# Patient Record
Sex: Male | Born: 1995 | Race: Black or African American | Hispanic: No | Marital: Single | Smoking: Never smoker
Health system: Southern US, Community
[De-identification: ages and names within clinical notes are randomized; demographics above are authoritative.]

## PROBLEM LIST (undated history)

## (undated) ENCOUNTER — Ambulatory Visit (HOSPITAL_COMMUNITY): Payer: BLUE CROSS/BLUE SHIELD

---
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
## 2019-09-19 ENCOUNTER — Ambulatory Visit (HOSPITAL_COMMUNITY)
Admission: EM | Admit: 2019-09-19 | Discharge: 2019-09-19 | Disposition: A | Discharge status: Home / Self Care | Payer: 59 | Attending: Family Medicine | Admitting: Family Medicine

## 2019-09-19 ENCOUNTER — Encounter (HOSPITAL_COMMUNITY): Payer: Self-pay

## 2019-09-19 DIAGNOSIS — U071 COVID-19: Secondary | ICD-10-CM | POA: Diagnosis not present

## 2019-09-19 DIAGNOSIS — Z20828 Contact with and (suspected) exposure to other viral communicable diseases: Secondary | ICD-10-CM

## 2019-09-19 DIAGNOSIS — R05 Cough: Secondary | ICD-10-CM

## 2019-09-19 DIAGNOSIS — R438 Other disturbances of smell and taste: Secondary | ICD-10-CM

## 2019-09-19 DIAGNOSIS — R0981 Nasal congestion: Secondary | ICD-10-CM

## 2019-09-19 DIAGNOSIS — R059 Cough, unspecified: Secondary | ICD-10-CM

## 2019-09-19 DIAGNOSIS — Z20822 Contact with and (suspected) exposure to covid-19: Secondary | ICD-10-CM

## 2019-09-19 NOTE — ED Triage Notes (Signed)
Pt present loss of smell and taste with some coughing and nasal congestion. Symptoms started two days ago.

## 2019-09-19 NOTE — Discharge Instructions (Addendum)
Your COVID test is pending.  You should self quarantine until your test result is back and is negative.   ° °Go to the emergency department if you develop high fever, shortness of breath, severe diarrhea, or other concerning symptoms.   ° °

## 2019-09-19 NOTE — ED Provider Notes (Signed)
[facility]    CSN: [id number] Arrival date & time: 09/19/19  1634      History   Chief Complaint Chief Complaint  Patient presents with  . Loss of smell and taste  . Cough  . Nasal Congestion    HPI Tony Lucas is a 23 y.o. male.   Patient presents with 2-day history of nonproductive cough, nasal congestion, loss of sense of smell/taste.  He denies fever, chills, sore throat, shortness of breath, abdominal pain, vomiting, diarrhea, rash, or other symptoms.  No treatments attempted at home.  No pertinent medical history.     The history is provided by the patient.    History reviewed. No pertinent past medical history.  There are no active problems to display for this patient.   History reviewed. No pertinent surgical history.     Home Medications    Prior to Admission medications   Medication Sig Start Date End Date Taking? Authorizing Provider  amoxicillin (AMOXIL) 500 MG capsule Take 2 capsules (1,000 mg total) by mouth 3 (three) times daily. 01/01/13   Godfrey Pick, PA-C  ofloxacin (FLOXIN) 0.3 % otic solution Place 5 drops into the left ear 2 (two) times daily. 01/01/13   Godfrey Pick, PA-C    Family History Family History  Problem Relation Age of Onset  . Hypertension Mother     Social History Social History   Tobacco Use  . Smoking status: Never Smoker  . Smokeless tobacco: Never Used  Substance Use Topics  . Alcohol use: No  . Drug use: No     Allergies   Patient has no known allergies.   Review of Systems Review of Systems  Constitutional: Negative for chills and fever.  HENT: Positive for congestion. Negative for ear pain and sore throat.   Eyes: Negative for pain and visual disturbance.  Respiratory: Positive for cough. Negative for shortness of breath.   Cardiovascular: Negative for chest pain and palpitations.  Gastrointestinal: Negative for abdominal pain and vomiting.  Genitourinary: Negative for dysuria and  hematuria.  Musculoskeletal: Negative for arthralgias and back pain.  Skin: Negative for color change and rash.  Neurological: Negative for seizures and syncope.  All other systems reviewed and are negative.    Physical Exam Triage Vital Signs ED Triage Vitals  Enc Vitals Group     BP      Pulse      Resp      Temp      Temp src      SpO2      Weight      Height      Head Circumference      Peak Flow      Pain Score      Pain Loc      Pain Edu?      Excl. in GC?    No data found.  Updated Vital Signs BP (!) 132/59 (BP Location: Left Arm)   Pulse 69   Temp 99.4 F (37.4 C) (Oral)   Resp 16   SpO2 100%   Visual Acuity Right Eye Distance:   Left Eye Distance:   Bilateral Distance:    Right Eye Near:   Left Eye Near:    Bilateral Near:     Physical Exam Vitals signs and nursing note reviewed.  Constitutional:      General: He is not in acute distress.    Appearance: He is well-developed. He is not ill-appearing.  HENT:  Head: Normocephalic and atraumatic.     Right Ear: Tympanic membrane normal.     Left Ear: Tympanic membrane normal.     Nose: Nose normal.     Mouth/Throat:     Mouth: Mucous membranes are moist.     Pharynx: Oropharynx is clear.  Eyes:     Conjunctiva/sclera: Conjunctivae normal.  Neck:     Musculoskeletal: Neck supple.  Cardiovascular:     Rate and Rhythm: Normal rate and regular rhythm.     Heart sounds: No murmur.  Pulmonary:     Effort: Pulmonary effort is normal. No respiratory distress.     Breath sounds: Normal breath sounds.  Abdominal:     General: Bowel sounds are normal.     Palpations: Abdomen is soft.     Tenderness: There is no abdominal tenderness. There is no guarding or rebound.  Skin:    General: Skin is warm and dry.     Findings: No rash.  Neurological:     General: No focal deficit present.     Mental Status: He is alert and oriented to person, place, and time.      UC Treatments / Results  Labs  (all labs ordered are listed, but only abnormal results are displayed) Labs Reviewed  NOVEL CORONAVIRUS, NAA (HOSP ORDER, SEND-OUT TO REF LAB; TAT 18-24 HRS)    EKG   Radiology No results found.  Procedures Procedures (including critical care time)  Medications Ordered in UC Medications - No data to display  Initial Impression / Assessment and Plan / UC Course  I have reviewed the triage vital signs and the nursing notes.  Pertinent labs & imaging results that were available during my care of the patient were reviewed by me and considered in my medical decision making (see chart for details).   Cough, Suspected Covid.  Instructed patient to take Tylenol as needed for fever or discomfort.  COVID test performed here.  Instructed patient to self quarantine until the test result is back.  Instructed patient to go to the emergency department if he develops high fever, shortness of breath, severe diarrhea, or other concerning symptoms.  Patient agrees with plan of care.    Final Clinical Impressions(s) / UC Diagnoses   Final diagnoses:  Cough  Suspected COVID-19 virus infection     Discharge Instructions     Your COVID test is pending.  You should self quarantine until your test result is back and is negative.    Go to the emergency department if you develop high fever, shortness of breath, severe diarrhea, or other concerning symptoms.       ED Prescriptions    None     PDMP not reviewed this encounter.   Sharion Balloon, NP 09/19/19 [phone]

## 2019-09-20 LAB — NOVEL CORONAVIRUS, NAA (HOSP ORDER, SEND-OUT TO REF LAB; TAT 18-24 HRS): SARS-CoV-2, NAA: DETECTED — AB

## 2019-09-21 ENCOUNTER — Telehealth (HOSPITAL_COMMUNITY): Payer: Self-pay | Admitting: Emergency Medicine

## 2019-09-21 NOTE — Telephone Encounter (Signed)
Positive covid, discussed results with patient over the phone, about quarantine times and s/s to watch out for. Pt verbalized understanding, all questions answered.

## 2019-09-22 ENCOUNTER — Telehealth (HOSPITAL_COMMUNITY): Payer: Self-pay | Admitting: Emergency Medicine

## 2019-09-22 NOTE — Telephone Encounter (Signed)
Patient called asking what pharmacy her medications had been sent to.  Reviewed notes from most recent visit 1026/2020-no medicines sent to pharmacy, no discussion of medicines in provider note.   When patient was asked what medicines she was expecting, she replied with names of 2 medications-looking ar medical record, these 2 drugs were prescribed at a previous visit several years ago.  Patient accepted response and thanked this nurse and patient ended call.

## 2021-09-25 ENCOUNTER — Ambulatory Visit (HOSPITAL_COMMUNITY)
Admission: EM | Admit: 2021-09-25 | Discharge: 2021-09-25 | Disposition: A | Discharge status: Home / Self Care | Payer: BLUE CROSS/BLUE SHIELD | Attending: Family Medicine | Admitting: Family Medicine

## 2021-09-25 ENCOUNTER — Encounter (HOSPITAL_COMMUNITY): Payer: Self-pay

## 2021-09-25 ENCOUNTER — Other Ambulatory Visit: Payer: Self-pay

## 2021-09-25 DIAGNOSIS — R509 Fever, unspecified: Secondary | ICD-10-CM | POA: Insufficient documentation

## 2021-09-25 DIAGNOSIS — H65192 Other acute nonsuppurative otitis media, left ear: Secondary | ICD-10-CM | POA: Insufficient documentation

## 2021-09-25 DIAGNOSIS — J069 Acute upper respiratory infection, unspecified: Secondary | ICD-10-CM | POA: Diagnosis not present

## 2021-09-25 DIAGNOSIS — R059 Cough, unspecified: Secondary | ICD-10-CM | POA: Diagnosis not present

## 2021-09-25 DIAGNOSIS — U071 COVID-19: Secondary | ICD-10-CM | POA: Insufficient documentation

## 2021-09-25 LAB — SARS CORONAVIRUS 2 (TAT 6-24 HRS): SARS Coronavirus 2: POSITIVE — AB

## 2021-09-25 MED ORDER — LIDOCAINE VISCOUS HCL 2 % MT SOLN: 5.0000 mL | OROMUCOSAL | 0 refills | Status: DC | PRN

## 2021-09-25 MED ORDER — PROMETHAZINE-DM 6.25-15 MG/5ML PO SYRP: 5.0000 mL | ORAL_SOLUTION | Freq: Four times a day (QID) | ORAL | 0 refills | Status: DC | PRN

## 2021-09-25 NOTE — ED Provider Notes (Signed)
[facility]    CSN: [id number] Arrival date & time: 09/25/21  1632      History   Chief Complaint Chief Complaint  Patient presents with   Fever   Ear Pain   Cough    HPI Tony Lucas is a 25 y.o. male.   Patient presenting today with 2-day history of left ear irritation, congestion, sore throat, fever, cough.  Denies chest pain, shortness of breath, abdominal pain, nausea vomiting diarrhea, body aches, chills.  So far taking ibuprofen with mild temporary relief of symptoms.  No known sick contacts that he is aware of.  No known chronic pertinent medical problems.  History reviewed. No pertinent past medical history.  There are no problems to display for this patient.   History reviewed. No pertinent surgical history.     Home Medications    Prior to Admission medications   Medication Sig Start Date End Date Taking? Authorizing Provider  lidocaine (XYLOCAINE) 2 % solution Use as directed 5 mLs in the mouth or throat every 4 (four) hours as needed for mouth pain. 09/25/21  Yes Particia Nearing, PA-C  promethazine-dextromethorphan (PROMETHAZINE-DM) 6.25-15 MG/5ML syrup Take 5 mLs by mouth 4 (four) times daily as needed for cough. 09/25/21  Yes Particia Nearing, PA-C  amoxicillin (AMOXIL) 500 MG capsule Take 2 capsules (1,000 mg total) by mouth 3 (three) times daily. 01/01/13   Godfrey Pick, PA-C  ofloxacin (FLOXIN) 0.3 % otic solution Place 5 drops into the left ear 2 (two) times daily. 01/01/13   Godfrey Pick, PA-C    Family History Family History  Problem Relation Age of Onset   Hypertension Mother     Social History Social History   Tobacco Use   Smoking status: Never   Smokeless tobacco: Never  Substance Use Topics   Alcohol use: No   Drug use: No     Allergies   Patient has no known allergies.   Review of Systems Review of Systems Per HPI  Physical Exam Triage Vital Signs ED Triage Vitals  Enc Vitals Group     BP  09/25/21 1751 126/71     Pulse Rate 09/25/21 1751 71     Resp 09/25/21 1751 19     Temp 09/25/21 1751 99.7 F (37.6 C)     Temp Source 09/25/21 1751 Oral     SpO2 09/25/21 1751 98 %     Weight --      Height --      Head Circumference --      Peak Flow --      Pain Score 09/25/21 1752 7     Pain Loc --      Pain Edu? --      Excl. in GC? --    No data found.  Updated Vital Signs BP 126/71 (BP Location: Right Arm)   Pulse 71   Temp 99.7 F (37.6 C) (Oral)   Resp 19   SpO2 98%   Visual Acuity Right Eye Distance:   Left Eye Distance:   Bilateral Distance:    Right Eye Near:   Left Eye Near:    Bilateral Near:     Physical Exam Vitals and nursing note reviewed.  Constitutional:      Appearance: Normal appearance.  HENT:     Head: Atraumatic.     Right Ear: Tympanic membrane normal.     Left Ear: Tympanic membrane normal.     Nose: Rhinorrhea present.  Mouth/Throat:     Mouth: Mucous membranes are moist.     Pharynx: Posterior oropharyngeal erythema present. No oropharyngeal exudate.  Eyes:     Extraocular Movements: Extraocular movements intact.     Conjunctiva/sclera: Conjunctivae normal.  Cardiovascular:     Rate and Rhythm: Normal rate and regular rhythm.     Heart sounds: Normal heart sounds.  Pulmonary:     Effort: Pulmonary effort is normal. No respiratory distress.     Breath sounds: Normal breath sounds. No wheezing.  Musculoskeletal:        General: Normal range of motion.     Cervical back: Normal range of motion and neck supple.  Skin:    General: Skin is warm and dry.  Neurological:     General: No focal deficit present.     Mental Status: He is oriented to person, place, and time.  Psychiatric:        Mood and Affect: Mood normal.        Thought Content: Thought content normal.        Judgment: Judgment normal.     UC Treatments / Results  Labs (all labs ordered are listed, but only abnormal results are displayed) Labs Reviewed   RESPIRATORY PANEL BY PCR  SARS CORONAVIRUS 2 (TAT 6-24 HRS)    EKG   Radiology No results found.  Procedures Procedures (including critical care time)  Medications Ordered in UC Medications - No data to display  Initial Impression / Assessment and Plan / UC Course  I have reviewed the triage vital signs and the nursing notes.  Pertinent labs & imaging results that were available during my care of the patient were reviewed by me and considered in my medical decision making (see chart for details).     Suspect viral illness causing symptoms, vital signs and exam overall reassuring today and he appears in no acute distress.  Respiratory panel, COVID swab pending.  Treat with Phenergan DM, viscous lidocaine for symptomatic benefit in addition to over-the-counter cold and congestion medications and fever reducers.  Discussed Flonase, Sudafed to help with the ear irritation, pressure.  Work note given.  Return for acutely worsening symptoms.  Final Clinical Impressions(s) / UC Diagnoses   Final diagnoses:  Viral URI with cough  Fever, unspecified  Acute effusion of left ear   Discharge Instructions   None    ED Prescriptions     Medication Sig Dispense Auth. Provider   promethazine-dextromethorphan (PROMETHAZINE-DM) 6.25-15 MG/5ML syrup Take 5 mLs by mouth 4 (four) times daily as needed for cough. 100 mL Particia Nearing, PA-C   lidocaine (XYLOCAINE) 2 % solution Use as directed 5 mLs in the mouth or throat every 4 (four) hours as needed for mouth pain. 50 mL Particia Nearing, [place]      PDMP not reviewed this encounter.   Particia Nearing, [place] 09/25/21 1815

## 2021-09-25 NOTE — ED Triage Notes (Signed)
Pt presents with L ear itching, congestion, sore throat and fever x 2 days.   Pt states he has taken Ibuprofen at  home.

## 2021-09-27 LAB — RESPIRATORY PANEL BY PCR

## 2021-10-01 ENCOUNTER — Telehealth (HOSPITAL_COMMUNITY): Payer: Self-pay | Admitting: Internal Medicine

## 2021-10-01 MED ORDER — PROMETHAZINE-DM 6.25-15 MG/5ML PO SYRP: 5.0000 mL | ORAL_SOLUTION | Freq: Four times a day (QID) | ORAL | 0 refills | Status: DC | PRN

## 2021-10-01 NOTE — Telephone Encounter (Signed)
Medication refill sent to the pharmacy  

## 2021-10-29 ENCOUNTER — Other Ambulatory Visit: Payer: Self-pay

## 2021-10-29 ENCOUNTER — Ambulatory Visit
Admission: EM | Admit: 2021-10-29 | Discharge: 2021-10-29 | Disposition: A | Discharge status: Home / Self Care | Payer: BC Managed Care – PPO | Attending: Emergency Medicine | Admitting: Emergency Medicine

## 2021-10-29 DIAGNOSIS — M545 Low back pain, unspecified: Secondary | ICD-10-CM

## 2021-10-29 MED ORDER — BACLOFEN 10 MG PO TABS: 10.0000 mg | ORAL_TABLET | Freq: Three times a day (TID) | ORAL | 0 refills | Status: AC

## 2021-10-29 MED ORDER — IBUPROFEN 800 MG PO TABS: 800.0000 mg | ORAL_TABLET | Freq: Three times a day (TID) | ORAL | 0 refills | Status: DC

## 2021-10-29 MED ORDER — KETOROLAC TROMETHAMINE 60 MG/2ML IM SOLN
60.0000 mg | Freq: Once | INTRAMUSCULAR | Status: AC
  Administered 2021-10-29: 60 mg via INTRAMUSCULAR

## 2021-10-29 NOTE — Discharge Instructions (Signed)
You received an injection of ketorolac today, high-dose nonsteroidal anti-inflammatory pain medication that should significantly reduce your pain for the next 6 to 8 hours.  Please begin taking baclofen and ibuprofen tonight, prescriptions have been sent to your pharmacy.  I have provided you with a note to be out of work for the next few days, please take advantage of this time to drink plenty of fluid, get rest and perform gentle stretching as tolerated without pain.
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
## 2022-04-16 ENCOUNTER — Ambulatory Visit (HOSPITAL_COMMUNITY)
Admission: EM | Admit: 2022-04-16 | Discharge: 2022-04-16 | Disposition: A | Discharge status: Home / Self Care | Payer: BC Managed Care – PPO | Attending: Emergency Medicine | Admitting: Emergency Medicine

## 2022-04-16 ENCOUNTER — Ambulatory Visit (INDEPENDENT_AMBULATORY_CARE_PROVIDER_SITE_OTHER): Payer: BC Managed Care – PPO

## 2022-04-16 ENCOUNTER — Encounter (HOSPITAL_COMMUNITY): Payer: Self-pay

## 2022-04-16 DIAGNOSIS — M25511 Pain in right shoulder: Secondary | ICD-10-CM

## 2022-04-16 DIAGNOSIS — M25561 Pain in right knee: Secondary | ICD-10-CM | POA: Diagnosis not present

## 2022-04-16 DIAGNOSIS — M898X1 Other specified disorders of bone, shoulder: Secondary | ICD-10-CM | POA: Diagnosis not present

## 2022-04-16 MED ORDER — IBUPROFEN 800 MG PO TABS
ORAL_TABLET | ORAL | Status: AC
  Filled 2022-04-16: qty 1

## 2022-04-16 MED ORDER — IBUPROFEN 800 MG PO TABS
800.0000 mg | ORAL_TABLET | Freq: Once | ORAL | Status: AC
  Administered 2022-04-16: 800 mg via ORAL

## 2022-04-16 MED ORDER — IBUPROFEN 100 MG/5ML PO SUSP: 400.0000 mg | Freq: Once | ORAL | Status: DC

## 2022-04-16 MED ORDER — IBUPROFEN 800 MG PO TABS: 800.0000 mg | ORAL_TABLET | Freq: Three times a day (TID) | ORAL | 0 refills | Status: DC | PRN

## 2022-04-16 NOTE — ED Provider Notes (Incomplete)
  [facility]    CSN: [id number] Arrival date & time: 04/16/22  1657      History   Chief Complaint Chief Complaint  Patient presents with   Shoulder Pain   Knee Pain    HPI Tony Lucas is a 26 y.o. male.   HPI  History reviewed. No pertinent past medical history.  There are no problems to display for this patient.   History reviewed. No pertinent surgical history.     Home Medications    Prior to Admission medications   Not on File    Family History Family History  Problem Relation Age of Onset   Hypertension Mother     Social History Social History   Tobacco Use   Smoking status: Never   Smokeless tobacco: Never  Substance Use Topics   Alcohol use: No   Drug use: No     Allergies   Patient has no known allergies.   Review of Systems Review of Systems   Physical Exam Triage Vital Signs ED Triage Vitals  Enc Vitals Group     BP 04/16/22 1714 138/73     Pulse Rate 04/16/22 1714 88     Resp 04/16/22 1714 16     Temp 04/16/22 1714 98 F (36.7 C)     Temp Source 04/16/22 1714 Oral     SpO2 04/16/22 1714 99 %     Weight --      Height --      Head Circumference --      Peak Flow --      Pain Score 04/16/22 1716 10     Pain Loc --      Pain Edu? --      Excl. in GC? --    No data found.  Updated Vital Signs BP 138/73 (BP Location: Left Arm)   Pulse 88   Temp 98 F (36.7 C) (Oral)   Resp 16   SpO2 99%   Visual Acuity Right Eye Distance:   Left Eye Distance:   Bilateral Distance:    Right Eye Near:   Left Eye Near:    Bilateral Near:     Physical Exam   UC Treatments / Results  Labs (all labs ordered are listed, but only abnormal results are displayed) Labs Reviewed - No data to display  EKG   Radiology No results found.  Procedures Procedures (including critical care time)  Medications Ordered in UC Medications  ibuprofen (ADVIL) tablet 800 mg (800 mg Oral Given 04/16/22 1758)    Initial  Impression / Assessment and Plan / UC Course  I have reviewed the triage vital signs and the nursing notes.  Pertinent labs & imaging results that were available during my care of the patient were reviewed by me and considered in my medical decision making (see chart for details).     *** Final Clinical Impressions(s) / UC Diagnoses   Final diagnoses:  Acute pain of right knee  Clavicle pain   Discharge Instructions   None    ED Prescriptions   None    PDMP not reviewed this encounter.

## 2022-04-16 NOTE — Discharge Instructions (Addendum)
The x-ray of your clavicle, shoulder and knee are reassuringly normal, there is no sign of any acute fracture.  I have included the x-ray reports in your checkout sheet today for your records.  I provided you with a prescription for ibuprofen 800 mg that I want you to take 3 times daily as needed for pain.  For comfort, if you would like to purchase a soft knee brace from the pharmacy for stabilization I think this might be helpful.  The ones that we have here are rigid and would not serve this purpose well.  Please apply ice to affected areas for the next 24 to 48 hours, then begin to apply heat as needed for comfort.  Ice will reduce inflammation and, once the inflammation has decreased, heat will increase circulation and promote healing.  Please follow-up with [facility] at the urgent care walk-in clinic in 5 to 7 days if you have not had significant improvement of your pain and swelling.  Please go to the emergency room if your pain worsens during these next 5 to 7 days.  Thank you for visiting urgent care today.

## 2022-04-16 NOTE — ED Provider Notes (Signed)
[facility]    CSN: [id number] Arrival date & time: 04/16/22  1657    HISTORY   Chief Complaint  Patient presents with   Shoulder Pain   Knee Pain   HPI Tony Lucas is a 26 y.o. male. Pt states he injured his right clavicle and right knee last night while playing basketball, states he was body checked by a player on the opposing team who hit him on the right side of his chest with his right shoulder causing him to fall.  Patient states he fell awkwardly and now his knee is swollen and sore as well. Pt states he took 400 mg of ibuprofen without relief.  Patient states he is notices had some swelling in the middle aspect of his right upper chest across his clavicle bone, reports pain with trying to shrug his shoulders.  Patient is weightbearing and ambulating without assistance.  Patient denies prior injury to right knee, right clavicle, right shoulder.  The history is provided by the patient.  History reviewed. No pertinent past medical history. There are no problems to display for this patient.  History reviewed. No pertinent surgical history.  Home Medications    Prior to Admission medications   Medication Sig Start Date End Date Taking? Authorizing Provider  ibuprofen (ADVIL) 800 MG tablet Take 1 tablet (800 mg total) by mouth 3 (three) times daily. 10/29/21   Theadora Rama Scales, PA-C    Family History Family History  Problem Relation Age of Onset   Hypertension Mother    Social History Social History   Tobacco Use   Smoking status: Never   Smokeless tobacco: Never  Substance Use Topics   Alcohol use: No   Drug use: No   Allergies   Patient has no known allergies.  Review of Systems Review of Systems Pertinent findings noted in history of present illness.   Physical Exam Triage Vital Signs ED Triage Vitals  Enc Vitals Group     BP 09/21/21 0827 (!) 147/82     Pulse Rate 09/21/21 0827 72     Resp 09/21/21 0827 18     Temp 09/21/21 0827  98.3 F (36.8 C)     Temp Source 09/21/21 0827 Oral     SpO2 09/21/21 0827 98 %     Weight --      Height --      Head Circumference --      Peak Flow --      Pain Score 09/21/21 0826 5     Pain Loc --      Pain Edu? --      Excl. in GC? --   No data found.  Updated Vital Signs BP 138/73 (BP Location: Left Arm)   Pulse 88   Temp 98 F (36.7 C) (Oral)   Resp 16   SpO2 99%   Physical Exam Chest:     Chest wall: Deformity, swelling and tenderness present. No mass, lacerations, crepitus or edema. There is no dullness to percussion.  Breasts:    Breasts are symmetrical.    Musculoskeletal:     Right shoulder: No swelling, deformity, effusion, laceration, tenderness, bony tenderness or crepitus. Decreased range of motion. Decreased strength.     Left shoulder: Normal.     Right upper arm: Normal.     Left upper arm: Normal.     Cervical back: Normal.     Thoracic back: Normal.     Lumbar back: Normal.  Right hip: Normal.     Left hip: Normal.     Right upper leg: Normal.     Left upper leg: Normal.     Right knee: Swelling, effusion, erythema, ecchymosis and laceration present. No deformity, bony tenderness or crepitus. Normal range of motion. Tenderness present over the patellar tendon. Normal alignment.     Left knee: Normal.     Right lower leg: Normal.     Left lower leg: Normal.    Visual Acuity Right Eye Distance:   Left Eye Distance:   Bilateral Distance:    Right Eye Near:   Left Eye Near:    Bilateral Near:     UC Couse / Diagnostics / Procedures:    EKG  Radiology DG Clavicle Right  Result Date: 04/16/2022 CLINICAL DATA:  Injured right shoulder and knee last night while playing basketball. EXAM: RIGHT CLAVICLE - 2+ VIEWS COMPARISON:  None Available. FINDINGS: Normal bone mineralization. The sternoclavicular and acromioclavicular joint spaces are normally aligned. No acute fracture. The glenohumeral joint is unremarkable. IMPRESSION: Normal right  clavicle radiographs. Electronically Signed   By: Neita Garnetonald  Viola M.D.   On: 04/16/2022 18:13   DG Knee Complete 4 Views Right  Result Date: 04/16/2022 CLINICAL DATA:  Right knee injury while playing basketball yesterday. EXAM: RIGHT KNEE - COMPLETE 4+ VIEW COMPARISON:  None Available. FINDINGS: Normal bone mineralization. Joint spaces are preserved. No joint effusion. No acute fracture or dislocation. IMPRESSION: Normal right knee radiographs. Electronically Signed   By: Neita Garnetonald  Viola M.D.   On: 04/16/2022 18:13    Procedures Procedures (including critical care time)  UC Diagnoses / Final Clinical Impressions(s)   I have reviewed the triage vital signs and the nursing notes.  Pertinent labs & imaging results that were available during my care of the patient were reviewed by me and considered in my medical decision making (see chart for details).    Final diagnoses:  Acute pain of right knee  Clavicle pain   X-rays of clavicle and knee are unremarkable for acute bony injury.  Patient reassured.  Patient provided with prescription for ibuprofen, recommend ice then heat for the next few days.  Patient advised to go to [facility] urgent care for follow-up if needed.  Patient also advised to go to emergency room for worsening pain.  Patient advised he may benefit from a soft brace that can be purchased at the pharmacy for comfort.  Return precautions advised.  ED Prescriptions     Medication Sig Dispense Auth. Provider   ibuprofen (ADVIL) 800 MG tablet Take 1 tablet (800 mg total) by mouth every 8 (eight) hours as needed for up to 21 doses for fever, headache, mild pain or moderate pain. 21 tablet Theadora RamaMorgan, Janissa Bertram Scales, PA-C      PDMP not reviewed this encounter.  Pending results:  Labs Reviewed - No data to display  Medications Ordered in UC: Medications  ibuprofen (ADVIL) tablet 800 mg (800 mg Oral Given 04/16/22 1758)    Disposition Upon Discharge:  Condition: stable for  discharge home Home: take medications as prescribed; routine discharge instructions as discussed; follow up as advised.  Patient presented with an acute illness with associated systemic symptoms and significant discomfort requiring urgent management. In my opinion, this is a condition that a prudent lay person (someone who possesses an average knowledge of health and medicine) may potentially expect to result in complications if not addressed urgently such as respiratory distress, impairment of bodily function or dysfunction  of bodily organs.   Routine symptom specific, illness specific and/or disease specific instructions were discussed with the patient and/or caregiver at length.   As such, the patient has been evaluated and assessed, work-up was performed and treatment was provided in alignment with urgent care protocols and evidence based medicine.  Patient/parent/caregiver has been advised that the patient may require follow up for further testing and treatment if the symptoms continue in spite of treatment, as clinically indicated and appropriate.  Patient/parent/caregiver has been advised to return to the [facility] or PCP if no better; to PCP or the Emergency Department if new signs and symptoms develop, or if the current signs or symptoms continue to change or worsen for further workup, evaluation and treatment as clinically indicated and appropriate  The patient will follow up with their current PCP if and as advised. If the patient does not currently have a PCP we will assist them in obtaining one.   The patient may need specialty follow up if the symptoms continue, in spite of conservative treatment and management, for further workup, evaluation, consultation and treatment as clinically indicated and appropriate.   Patient/parent/caregiver verbalized understanding and agreement of plan as discussed.  All questions were addressed during visit.  Please see discharge instructions below for further  details of plan.  Discharge Instructions:   Discharge Instructions      The x-ray of your clavicle, shoulder and knee are reassuringly normal, there is no sign of any acute fracture.  I have included the x-ray reports in your checkout sheet today for your records.  I provided you with a prescription for ibuprofen 800 mg that I want you to take 3 times daily as needed for pain.  For comfort, if you would like to purchase a soft knee brace from the pharmacy for stabilization I think this might be helpful.  The ones that we have here are rigid and would not serve this purpose well.  Please apply ice to affected areas for the next 24 to 48 hours, then begin to apply heat as needed for comfort.  Ice will reduce inflammation and, once the inflammation has decreased, heat will increase circulation and promote healing.  Please follow-up with [facility] at the urgent care walk-in clinic in 5 to 7 days if you have not had significant improvement of your pain and swelling.  Please go to the emergency room if your pain worsens during these next 5 to 7 days.  Thank you for visiting urgent care today.      This office note has been dictated using Teaching laboratory technician.  Unfortunately, and despite my best efforts, this method of dictation can sometimes lead to occasional typographical or grammatical errors.  I apologize in advance if this occurs.     Theadora Rama Scales, PA-C 04/16/22 1825

## 2022-04-16 NOTE — ED Triage Notes (Signed)
Pt states he injured his right shoulder and knee last night while playing basketball. Pt took ibuprofen with no relief.

## 2022-05-28 ENCOUNTER — Ambulatory Visit (HOSPITAL_COMMUNITY)
Admission: EM | Admit: 2022-05-28 | Discharge: 2022-05-28 | Disposition: A | Discharge status: Home / Self Care | Payer: 59 | Attending: Internal Medicine | Admitting: Internal Medicine

## 2022-05-28 ENCOUNTER — Encounter (HOSPITAL_COMMUNITY): Payer: Self-pay

## 2022-05-28 DIAGNOSIS — Z79899 Other long term (current) drug therapy: Secondary | ICD-10-CM | POA: Diagnosis not present

## 2022-05-28 DIAGNOSIS — J039 Acute tonsillitis, unspecified: Secondary | ICD-10-CM | POA: Insufficient documentation

## 2022-05-28 DIAGNOSIS — Z792 Long term (current) use of antibiotics: Secondary | ICD-10-CM | POA: Diagnosis not present

## 2022-05-28 DIAGNOSIS — J029 Acute pharyngitis, unspecified: Secondary | ICD-10-CM

## 2022-05-28 DIAGNOSIS — Z20822 Contact with and (suspected) exposure to covid-19: Secondary | ICD-10-CM | POA: Diagnosis not present

## 2022-05-28 DIAGNOSIS — R051 Acute cough: Secondary | ICD-10-CM | POA: Insufficient documentation

## 2022-05-28 LAB — POCT RAPID STREP A, ED / UC: Streptococcus, Group A Screen (Direct): NEGATIVE

## 2022-05-28 MED ORDER — AMOXICILLIN 500 MG PO CAPS: 500.0000 mg | ORAL_CAPSULE | Freq: Two times a day (BID) | ORAL | 0 refills | Status: AC

## 2022-05-28 MED ORDER — PROMETHAZINE-DM 6.25-15 MG/5ML PO SYRP: 5.0000 mL | ORAL_SOLUTION | Freq: Four times a day (QID) | ORAL | 0 refills | Status: DC | PRN

## 2022-05-28 NOTE — ED Provider Notes (Signed)
[facility]    CSN: [id number] Arrival date & time: 05/28/22  1359      History   Chief Complaint Chief Complaint  Patient presents with   Sore Throat    HPI Tony Lucas is a 26 y.o. male.   Patient presents with sore throat, right ear pain, cough that has been present for almost a week.  He reports that his significant other has had similar symptoms.  Denies any associated upper respiratory nasal congestion, runny nose, fever.  Denies chest pain, shortness of breath, nausea, vomiting, diarrhea, abdominal pain.  Patient has taken Motrin with minimal improvement.   Sore Throat    History reviewed. No pertinent past medical history.  There are no problems to display for this patient.   History reviewed. No pertinent surgical history.     Home Medications    Prior to Admission medications   Medication Sig Start Date End Date Taking? Authorizing Provider  amoxicillin (AMOXIL) 500 MG capsule Take 1 capsule (500 mg total) by mouth 2 (two) times daily for 10 days. 05/28/22 06/07/22 Yes Gadge Hermiz, Acie Fredrickson, FNP  promethazine-dextromethorphan (PROMETHAZINE-DM) 6.25-15 MG/5ML syrup Take 5 mLs by mouth 4 (four) times daily as needed for cough. 05/28/22  Yes Erma Raiche, Rolly Salter E, FNP  ibuprofen (ADVIL) 800 MG tablet Take 1 tablet (800 mg total) by mouth every 8 (eight) hours as needed for up to 21 doses for fever, headache, mild pain or moderate pain. 04/16/22   Theadora Rama Scales, PA-C    Family History Family History  Problem Relation Age of Onset   Hypertension Mother     Social History Social History   Tobacco Use   Smoking status: Never   Smokeless tobacco: Never  Substance Use Topics   Alcohol use: No   Drug use: No     Allergies   Patient has no known allergies.   Review of Systems Review of Systems Per HPI  Physical Exam Triage Vital Signs ED Triage Vitals  Enc Vitals Group     BP 05/28/22 1415 118/78     Pulse Rate 05/28/22 1415 (!) 56      Resp 05/28/22 1415 16     Temp 05/28/22 1415 98.3 F (36.8 C)     Temp Source 05/28/22 1415 Oral     SpO2 05/28/22 1415 98 %     Weight --      Height --      Head Circumference --      Peak Flow --      Pain Score 05/28/22 1416 10     Pain Loc --      Pain Edu? --      Excl. in GC? --    No data found.  Updated Vital Signs BP 118/78 (BP Location: Left Arm)   Pulse (!) 56   Temp 98.3 F (36.8 C) (Oral)   Resp 16   SpO2 98%   Visual Acuity Right Eye Distance:   Left Eye Distance:   Bilateral Distance:    Right Eye Near:   Left Eye Near:    Bilateral Near:     Physical Exam Constitutional:      General: He is not in acute distress.    Appearance: Normal appearance. He is not toxic-appearing or diaphoretic.  HENT:     Head: Normocephalic and atraumatic.     Right Ear: Tympanic membrane and ear canal normal.     Left Ear: Tympanic membrane and ear canal normal.  Nose: Congestion present.     Mouth/Throat:     Mouth: Mucous membranes are moist.     Pharynx: Posterior oropharyngeal erythema present. No oropharyngeal exudate.     Tonsils: Tonsillar exudate present. No tonsillar abscesses. 1+ on the right. 1+ on the left.  Eyes:     Extraocular Movements: Extraocular movements intact.     Conjunctiva/sclera: Conjunctivae normal.     Pupils: Pupils are equal, round, and reactive to light.  Cardiovascular:     Rate and Rhythm: Normal rate and regular rhythm.     Pulses: Normal pulses.     Heart sounds: Normal heart sounds.  Pulmonary:     Effort: Pulmonary effort is normal. No respiratory distress.     Breath sounds: Normal breath sounds. No stridor. No wheezing, rhonchi or rales.  Abdominal:     General: Abdomen is flat. Bowel sounds are normal.     Palpations: Abdomen is soft.  Musculoskeletal:        General: Normal range of motion.     Cervical back: Normal range of motion.  Skin:    General: Skin is warm and dry.  Neurological:     General: No focal  deficit present.     Mental Status: He is alert and oriented to person, place, and time. Mental status is at baseline.  Psychiatric:        Mood and Affect: Mood normal.        Behavior: Behavior normal.      UC Treatments / Results  Labs (all labs ordered are listed, but only abnormal results are displayed) Labs Reviewed  CULTURE, GROUP A STREP ([facility])  SARS CORONAVIRUS 2 (TAT 6-24 HRS)  POCT RAPID STREP A, ED / UC    EKG   Radiology No results found.  Procedures Procedures (including critical care time)  Medications Ordered in UC Medications - No data to display  Initial Impression / Assessment and Plan / UC Course  I have reviewed the triage vital signs and the nursing notes.  Pertinent labs & imaging results that were available during my care of the patient were reviewed by me and considered in my medical decision making (see chart for details).     Rapid strep was negative but still suspicious of strep throat given appearance of posterior pharynx and acute tonsillitis on exam.  Will opt to treat with amoxicillin antibiotic.  Throat culture and COVID test pending.  Patient requesting cough medication that was previously prescribed for viral illness months prior.  Will prescribe Promethazine DM as this appears to be what patient took.  Also patient was advised that this can cause drowsiness.  Discussed supportive care and symptom management with patient is well.  No signs of peritonsillar abscess on exam.  Discussed strict return precautions.  Patient verbalized understanding and was agreeable with plan. Final Clinical Impressions(s) / UC Diagnoses   Final diagnoses:  Tonsillitis  Sore throat  Acute cough     Discharge Instructions      Your rapid strep test was negative.  Throat culture and COVID test pending.  We will call if it is positive.  I am still highly suspicious of strep throat given appearance of your throat on exam so you are being treated with  amoxicillin antibiotic.  You have also been sent a cough medication to take as needed.  Please be advised that this cough medication can make you drowsy.  Do not drive while taking this cough medication.  Follow-up if symptoms  persist or worsen.     ED Prescriptions     Medication Sig Dispense Auth. Provider   amoxicillin (AMOXIL) 500 MG capsule Take 1 capsule (500 mg total) by mouth 2 (two) times daily for 10 days. 20 capsule Sabinal, [place] E, [place]   promethazine-dextromethorphan (PROMETHAZINE-DM) 6.25-15 MG/5ML syrup Take 5 mLs by mouth 4 (four) times daily as needed for cough. 118 mL Gustavus Bryant, [place]      PDMP not reviewed this encounter.   Gustavus Bryant, [place] 05/28/22 1517

## 2022-05-28 NOTE — ED Triage Notes (Signed)
Pt states sore throat and right ear pain for the past 4 days.  Has been taking motrin at home with relief.

## 2022-05-28 NOTE — Discharge Instructions (Signed)
Your rapid strep test was negative.  Throat culture and COVID test pending.  We will call if it is positive.  I am still highly suspicious of strep throat given appearance of your throat on exam so you are being treated with amoxicillin antibiotic.  You have also been sent a cough medication to take as needed.  Please be advised that this cough medication can make you drowsy.  Do not drive while taking this cough medication.  Follow-up if symptoms persist or worsen.

## 2022-05-29 LAB — SARS CORONAVIRUS 2 (TAT 6-24 HRS): SARS Coronavirus 2: NEGATIVE

## 2022-06-01 LAB — CULTURE, GROUP A STREP (THRC)

## 2023-03-02 ENCOUNTER — Ambulatory Visit (HOSPITAL_COMMUNITY)
Admission: EM | Admit: 2023-03-02 | Discharge: 2023-03-02 | Disposition: A | Discharge status: Home / Self Care | Payer: 59 | Attending: Family Medicine | Admitting: Family Medicine

## 2023-03-02 ENCOUNTER — Encounter (HOSPITAL_COMMUNITY): Payer: Self-pay | Admitting: Emergency Medicine

## 2023-03-02 ENCOUNTER — Other Ambulatory Visit: Payer: Self-pay

## 2023-03-02 DIAGNOSIS — L03314 Cellulitis of groin: Secondary | ICD-10-CM

## 2023-03-02 DIAGNOSIS — K0510 Chronic gingivitis, plaque induced: Secondary | ICD-10-CM

## 2023-03-02 MED ORDER — AMOXICILLIN-POT CLAVULANATE 875-125 MG PO TABS: 1.0000 | ORAL_TABLET | Freq: Two times a day (BID) | ORAL | 0 refills | Status: AC

## 2023-03-02 MED ORDER — IBUPROFEN 800 MG PO TABS: 800.0000 mg | ORAL_TABLET | Freq: Three times a day (TID) | ORAL | 0 refills | Status: DC | PRN

## 2023-03-02 NOTE — Discharge Instructions (Signed)
Take amoxicillin-clavulanate 875 mg--1 tab twice daily with food for 7 days  Take ibuprofen 800 mg--1 tab every 8 hours as needed for pain.  While you wait to get into a dentist, you could use an oral rinse as directed from over-the-counter, such as [employer] or peridex or listerine oral rinses.

## 2023-03-02 NOTE — ED Provider Notes (Signed)
[facility]    CSN: [id number] Arrival date & time: 03/02/23  1003      History   Chief Complaint Chief Complaint  Patient presents with   bump    HPI Tony Lucas is a 27 y.o. male.   HPI Here for swelling on his right mons pubis/right inguinal area.  Is been there about 5 days, but now is enlarging and more sore in the last 2 weeks.  No drainage so far.  He has been doing warm soaks   He also notes his gums bleeding when he brushes his teeth lately.  No swelling or pain otherwise.  He does not have a dentist History reviewed. No pertinent past medical history.  There are no problems to display for this patient.   History reviewed. No pertinent surgical history.     Home Medications    Prior to Admission medications   Medication Sig Start Date End Date Taking? Authorizing Provider  amoxicillin-clavulanate (AUGMENTIN) 875-125 MG tablet Take 1 tablet by mouth 2 (two) times daily for 7 days. 03/02/23 03/09/23 Yes Lashonda Sonneborn, Janace Aris, MD  ibuprofen (ADVIL) 800 MG tablet Take 1 tablet (800 mg total) by mouth every 8 (eight) hours as needed for up to 21 doses for fever, headache, mild pain or moderate pain. 03/02/23   Zenia Resides, MD    Family History Family History  Problem Relation Age of Onset   Hypertension Mother     Social History Social History   Tobacco Use   Smoking status: Never   Smokeless tobacco: Never  Vaping Use   Vaping Use: Never used  Substance Use Topics   Alcohol use: No   Drug use: No     Allergies   Patient has no known allergies.   Review of Systems Review of Systems   Physical Exam Triage Vital Signs ED Triage Vitals  Enc Vitals Group     BP 03/02/23 1022 105/66     Pulse Rate 03/02/23 1022 (!) 57     Resp 03/02/23 1022 18     Temp 03/02/23 1022 98 F (36.7 C)     Temp Source 03/02/23 1022 Oral     SpO2 03/02/23 1022 96 %     Weight --      Height --      Head Circumference --      Peak Flow --       Pain Score 03/02/23 1020 9     Pain Loc --      Pain Edu? --      Excl. in GC? --    No data found.  Updated Vital Signs BP 105/66 (BP Location: Right Arm)   Pulse (!) 57   Temp 98 F (36.7 C) (Oral)   Resp 18   SpO2 96%   Visual Acuity Right Eye Distance:   Left Eye Distance:   Bilateral Distance:    Right Eye Near:   Left Eye Near:    Bilateral Near:     Physical Exam Vitals reviewed.  Constitutional:      General: He is not in acute distress.    Appearance: He is not ill-appearing, toxic-appearing or diaphoretic.  HENT:     Nose: Nose normal.     Mouth/Throat:     Mouth: Mucous membranes are moist.     Pharynx: No oropharyngeal exudate or posterior oropharyngeal erythema.     Comments: No swelling in the mouth.  There is possibly a little gingival  swelling.  Eyes:     Extraocular Movements: Extraocular movements intact.     Conjunctiva/sclera: Conjunctivae normal.     Pupils: Pupils are equal, round, and reactive to light.  Genitourinary:    Comments: On the right aspect of the mons pubis there is induration and tenderness that is 3 cm by about 4 cm.  There is no fluctuance. Musculoskeletal:     Cervical back: Neck supple.  Lymphadenopathy:     Cervical: No cervical adenopathy.  Skin:    Coloration: Skin is not jaundiced or pale.  Neurological:     General: No focal deficit present.     Mental Status: He is alert and oriented to person, place, and time.  Psychiatric:        Behavior: Behavior normal.      UC Treatments / Results  Labs (all labs ordered are listed, but only abnormal results are displayed) Labs Reviewed - No data to display  EKG   Radiology No results found.  Procedures Procedures (including critical care time)  Medications Ordered in UC Medications - No data to display  Initial Impression / Assessment and Plan / UC Course  I have reviewed the triage vital signs and the nursing notes.  Pertinent labs & imaging results  that were available during my care of the patient were reviewed by me and considered in my medical decision making (see chart for details).        Augmentin is sent in for the cellulitis, and ibuprofen is sent in for pain.  He is given a list of low-cost dental providers, and GoodRx coupons are printed for his prescriptions. Final Clinical Impressions(s) / UC Diagnoses   Final diagnoses:  Cellulitis of groin     Discharge Instructions      Take amoxicillin-clavulanate 875 mg--1 tab twice daily with food for 7 days  Take ibuprofen 800 mg--1 tab every 8 hours as needed for pain.  While you wait to get into a dentist, you could use an oral rinse as directed from over-the-counter, such as [employer] or peridex or listerine oral rinses.       ED Prescriptions     Medication Sig Dispense Auth. Provider   ibuprofen (ADVIL) 800 MG tablet Take 1 tablet (800 mg total) by mouth every 8 (eight) hours as needed for up to 21 doses for fever, headache, mild pain or moderate pain. 21 tablet Zenia Resides, MD   amoxicillin-clavulanate (AUGMENTIN) 875-125 MG tablet Take 1 tablet by mouth 2 (two) times daily for 7 days. 14 tablet Jessi Pitstick, Janace Aris, MD      PDMP not reviewed this encounter.   Zenia Resides, MD 03/02/23 1050

## 2023-03-02 NOTE — ED Triage Notes (Signed)
Reports a "hair bump" in right lower pelvic area.  Noticed 5 days ago and getting larger.  Reports he has had these before.  Patient has used warm cloths on this are.  Patient has plucked hairs from this area

## 2023-03-02 NOTE — ED Triage Notes (Signed)
Patient also has question about gums bleeding and associated with pain.  Does not have a dentist.  Having questions about this situation.

## 2023-07-22 ENCOUNTER — Ambulatory Visit (HOSPITAL_COMMUNITY)
Admission: EM | Admit: 2023-07-22 | Discharge: 2023-07-22 | Disposition: A | Discharge status: Home / Self Care | Payer: Self-pay | Attending: Physician Assistant | Admitting: Physician Assistant

## 2023-07-22 ENCOUNTER — Encounter (HOSPITAL_COMMUNITY): Payer: Self-pay

## 2023-07-22 DIAGNOSIS — Z20822 Contact with and (suspected) exposure to covid-19: Secondary | ICD-10-CM | POA: Insufficient documentation

## 2023-07-22 NOTE — ED Triage Notes (Signed)
Pt presents for COVID test due to an exposure. Pt denies sxs.

## 2023-07-22 NOTE — Discharge Instructions (Signed)
Monitor MyChart for your COVID result.  If you develop any symptoms please return for reevaluation.

## 2023-07-22 NOTE — ED Provider Notes (Signed)
[facility]    CSN: [id number] Arrival date & time: 07/22/23  1407      History   Chief Complaint Chief Complaint  Patient presents with   COVID Test    HPI Tony Lucas is a 27 y.o. male.   Patient presents today requesting COVID-19 testing.  Reports that his sister with whom he lives tested positive a few days ago.  He is asymptomatic and denies any symptoms including fever, chest pain, shortness of breath, nausea, vomiting, headache, cough, congestion, body aches.  He is not taking any medication regularly.  He has had COVID-19 in the past with last episode a few years ago.  He has not had COVID-19 vaccines.  He denies any significant past medical history including cardiovascular disease, diabetes, smoking, chronic lung condition, immunosuppression.    History reviewed. No pertinent past medical history.  There are no problems to display for this patient.   History reviewed. No pertinent surgical history.     Home Medications    Prior to Admission medications   Medication Sig Start Date End Date Taking? Authorizing Provider  ibuprofen (ADVIL) 800 MG tablet Take 1 tablet (800 mg total) by mouth every 8 (eight) hours as needed for up to 21 doses for fever, headache, mild pain or moderate pain. 03/02/23   Zenia Resides, MD    Family History Family History  Problem Relation Age of Onset   Hypertension Mother     Social History Social History   Tobacco Use   Smoking status: Never   Smokeless tobacco: Never  Vaping Use   Vaping status: Never Used  Substance Use Topics   Alcohol use: No   Drug use: No     Allergies   Patient has no known allergies.   Review of Systems Review of Systems  Constitutional:  Negative for activity change, appetite change, fatigue and fever.  HENT:  Negative for congestion and sore throat.   Respiratory:  Negative for cough and shortness of breath.   Cardiovascular:  Negative for chest pain.   Gastrointestinal:  Negative for abdominal pain, diarrhea, nausea and vomiting.  Neurological:  Negative for headaches.     Physical Exam Triage Vital Signs ED Triage Vitals [07/22/23 1456]  Encounter Vitals Group     BP 119/72     Systolic BP Percentile      Diastolic BP Percentile      Pulse Rate 68     Resp 18     Temp 98.1 F (36.7 C)     Temp Source Oral     SpO2 100 %     Weight      Height      Head Circumference      Peak Flow      Pain Score 0     Pain Loc      Pain Education      Exclude from Growth Chart    No data found.  Updated Vital Signs BP 119/72 (BP Location: Right Arm)   Pulse 68   Temp 98.1 F (36.7 C) (Oral)   Resp 18   SpO2 100%   Visual Acuity Right Eye Distance:   Left Eye Distance:   Bilateral Distance:    Right Eye Near:   Left Eye Near:    Bilateral Near:     Physical Exam Vitals reviewed.  Constitutional:      General: He is awake.     Appearance: Normal appearance. He is well-developed. He  is not ill-appearing.     Comments: Very pleasant male appears stated age in no acute distress sitting comfortably in exam room  HENT:     Head: Normocephalic and atraumatic.     Right Ear: External ear normal. There is impacted cerumen.     Left Ear: Tympanic membrane, ear canal and external ear normal. Tympanic membrane is not erythematous or bulging.     Nose: Nose normal.     Mouth/Throat:     Pharynx: Uvula midline. No oropharyngeal exudate or posterior oropharyngeal erythema.  Cardiovascular:     Rate and Rhythm: Normal rate and regular rhythm.     Heart sounds: Normal heart sounds, S1 normal and S2 normal. No murmur heard. Pulmonary:     Effort: Pulmonary effort is normal. No accessory muscle usage or respiratory distress.     Breath sounds: Normal breath sounds. No stridor. No wheezing, rhonchi or rales.     Comments: Clear to auscultation bilaterally Neurological:     Mental Status: He is alert.  Psychiatric:         Behavior: Behavior is cooperative.      UC Treatments / Results  Labs (all labs ordered are listed, but only abnormal results are displayed) Labs Reviewed - No data to display  EKG   Radiology No results found.  Procedures Procedures (including critical care time)  Medications Ordered in UC Medications - No data to display  Initial Impression / Assessment and Plan / UC Course  I have reviewed the triage vital signs and the nursing notes.  Pertinent labs & imaging results that were available during my care of the patient were reviewed by me and considered in my medical decision making (see chart for details).     Patient is well-appearing, afebrile, nontoxic, nontachycardic.  We discussed that there is not currently a recommendation for testing asymptomatic individuals as there is no indication they need to self quarantine based on current CDC guidelines.  Patient expressed understanding but reported that his job is requesting a test given his exposure.  COVID testing was obtained and is pending.  He is to monitor his MyChart for these results.  He is young and otherwise healthy so not a candidate for antiviral therapy.  Recommended that he return if he develops any significant symptoms that do not respond to over-the-counter medications.  Strict return precautions given.  Final Clinical Impressions(s) / UC Diagnoses   Final diagnoses:  Close exposure to COVID-19 virus  Encounter for laboratory testing for COVID-19 virus     Discharge Instructions      Monitor MyChart for your COVID result.  If you develop any symptoms please return for reevaluation.     ED Prescriptions   None    PDMP not reviewed this encounter.   Jeani Hawking, PA-C 07/22/23 1541

## 2023-07-23 LAB — SARS CORONAVIRUS 2 (TAT 6-24 HRS): SARS Coronavirus 2: NEGATIVE

## 2023-10-21 ENCOUNTER — Other Ambulatory Visit: Payer: Self-pay

## 2023-10-21 ENCOUNTER — Emergency Department (HOSPITAL_COMMUNITY): Payer: Self-pay

## 2023-10-21 ENCOUNTER — Encounter (HOSPITAL_COMMUNITY): Payer: Self-pay | Admitting: Emergency Medicine

## 2023-10-21 ENCOUNTER — Emergency Department (HOSPITAL_COMMUNITY)
Admission: EM | Admit: 2023-10-21 | Discharge: 2023-10-21 | Disposition: A | Discharge status: Home / Self Care | Payer: Self-pay | Attending: Emergency Medicine | Admitting: Emergency Medicine

## 2023-10-21 DIAGNOSIS — H05231 Hemorrhage of right orbit: Secondary | ICD-10-CM

## 2023-10-21 DIAGNOSIS — D72829 Elevated white blood cell count, unspecified: Secondary | ICD-10-CM | POA: Insufficient documentation

## 2023-10-21 DIAGNOSIS — S01511A Laceration without foreign body of lip, initial encounter: Secondary | ICD-10-CM | POA: Insufficient documentation

## 2023-10-21 DIAGNOSIS — E876 Hypokalemia: Secondary | ICD-10-CM | POA: Insufficient documentation

## 2023-10-21 DIAGNOSIS — S0990XA Unspecified injury of head, initial encounter: Secondary | ICD-10-CM | POA: Insufficient documentation

## 2023-10-21 LAB — CBC
HCT: 43.8 % (ref 39.0–52.0)
Hemoglobin: 15.5 g/dL (ref 13.0–17.0)
MCH: 33.8 pg (ref 26.0–34.0)
MCHC: 35.4 g/dL (ref 30.0–36.0)
MCV: 95.4 fL (ref 80.0–100.0)
Platelets: 347 10*3/uL (ref 150–400)
RBC: 4.59 MIL/uL (ref 4.22–5.81)
RDW: 12.3 % (ref 11.5–15.5)
WBC: 19.8 10*3/uL — ABNORMAL HIGH (ref 4.0–10.5)
nRBC: 0 % (ref 0.0–0.2)

## 2023-10-21 LAB — BASIC METABOLIC PANEL
Anion gap: 13 (ref 5–15)
BUN: 9 mg/dL (ref 6–20)
CO2: 16 mmol/L — ABNORMAL LOW (ref 22–32)
Calcium: 8.4 mg/dL — ABNORMAL LOW (ref 8.9–10.3)
Chloride: 106 mmol/L (ref 98–111)
Creatinine, Ser: 1.15 mg/dL (ref 0.61–1.24)
GFR, Estimated: >60 mL/min (ref >60)
Glucose, Bld: 156 mg/dL — ABNORMAL HIGH (ref 70–99)
Potassium: 3 mmol/L — ABNORMAL LOW (ref 3.5–5.1)
Sodium: 135 mmol/L (ref 135–145)

## 2023-10-21 LAB — ETHANOL: Alcohol, Ethyl (B): 159 mg/dL — ABNORMAL HIGH (ref <10)

## 2023-10-21 MED ORDER — KETOROLAC TROMETHAMINE 60 MG/2ML IM SOLN
60.0000 mg | Freq: Once | INTRAMUSCULAR | Status: AC | PRN
  Administered 2023-10-21: 60 mg via INTRAMUSCULAR
  Filled 2023-10-21: qty 2

## 2023-10-21 MED ORDER — POTASSIUM CHLORIDE CRYS ER 20 MEQ PO TBCR
40.0000 meq | EXTENDED_RELEASE_TABLET | Freq: Once | ORAL | Status: AC
  Administered 2023-10-21: 40 meq via ORAL
  Filled 2023-10-21: qty 2

## 2023-10-21 MED ORDER — POTASSIUM CHLORIDE ER 10 MEQ PO TBCR: 10.0000 meq | EXTENDED_RELEASE_TABLET | Freq: Every day | ORAL | 0 refills | Status: AC

## 2023-10-21 MED ORDER — LIDOCAINE VISCOUS HCL 2 % MT SOLN
15.0000 mL | Freq: Once | OROMUCOSAL | Status: AC
  Administered 2023-10-21: 15 mL via OROMUCOSAL
  Filled 2023-10-21: qty 15

## 2023-10-21 MED ORDER — CYCLOBENZAPRINE HCL 10 MG PO TABS: 10.0000 mg | ORAL_TABLET | Freq: Two times a day (BID) | ORAL | 0 refills | Status: AC | PRN

## 2023-10-21 MED ORDER — IBUPROFEN 600 MG PO TABS
600.0000 mg | ORAL_TABLET | Freq: Four times a day (QID) | ORAL | 0 refills | Status: AC | PRN
Start: 2023-10-21 — End: ?
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
## 2023-10-21 NOTE — ED Notes (Signed)
Pt is resting in bed. Multiple attempts have been made to contact pts family.
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
## 2023-10-21 NOTE — Discharge Instructions (Addendum)
The cut on your lip should heal within the next 2 to 3 days.  Stick to a bland mostly liquid diet for the next 2 days to help with pain.  You can also take ibuprofen, Tylenol, muscle relaxers as needed for pain.  The swelling around your right eye can take 1 to 2 weeks to improve.  You can use cold packs or ice packs for 10 minutes at a time, on and off on this eye for the next 2 to 3 days.  If you do have vision problems, including blurred vision in your right eye when the swelling improves, please make an appointment with the eye doctor at the number above.  If experience sudden loss of vision, or you begin to see pus draining from the eye, please return immediately to the ER.  You may also be experiencing a concussion.  This can cause some mild confusion, headaches, dizziness, nausea, sensitivity to light.  Most people fully recover from a concussion within 2 to 4 weeks.  Avoid any contact sports or anything that may potentially injure your head in the meantime.  You should not drive until you feel completely normal and back to baseline, without dizziness.  Your potassium level is low today at 3.0.  Your provide your supplements to begin taking at home.  But you can also improve your potassium in your diet.  Review the information included.  *  Eye doctor - Dr. Shari Prows MD - contact if you have blurred vision issues with your right eye to schedule an office appointment  Address: [address], [place], [place] [address]  Phone: [phone]
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
## 2023-10-21 NOTE — ED Notes (Signed)
This Clinical research associate has attempted to call family multiple times with pts permission. No answer. Voicemail left on brothers phone with my name and to give me a call.
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
## 2024-01-05 IMAGING — DX DG KNEE COMPLETE 4+V*R*
4 series · 4 of 4 positions shown · non-contrast
Comparison: None Available.

CLINICAL DATA: Right knee injury while playing basketball
yesterday.

EXAM:
RIGHT KNEE - COMPLETE 4+ VIEW

[knee ap]
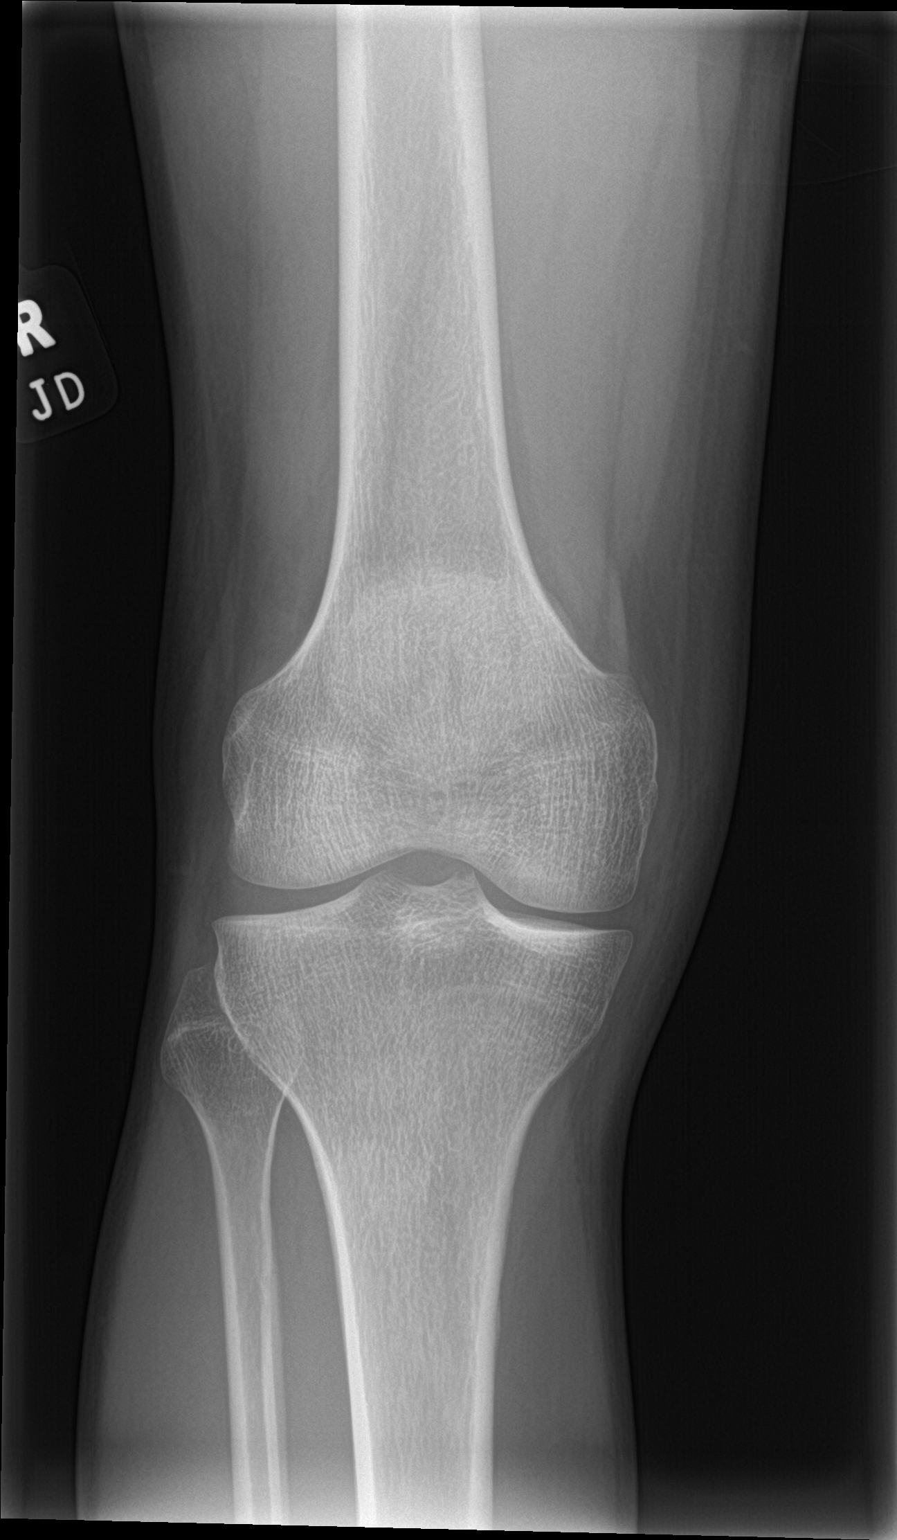

[knee obl (1 of 2)]
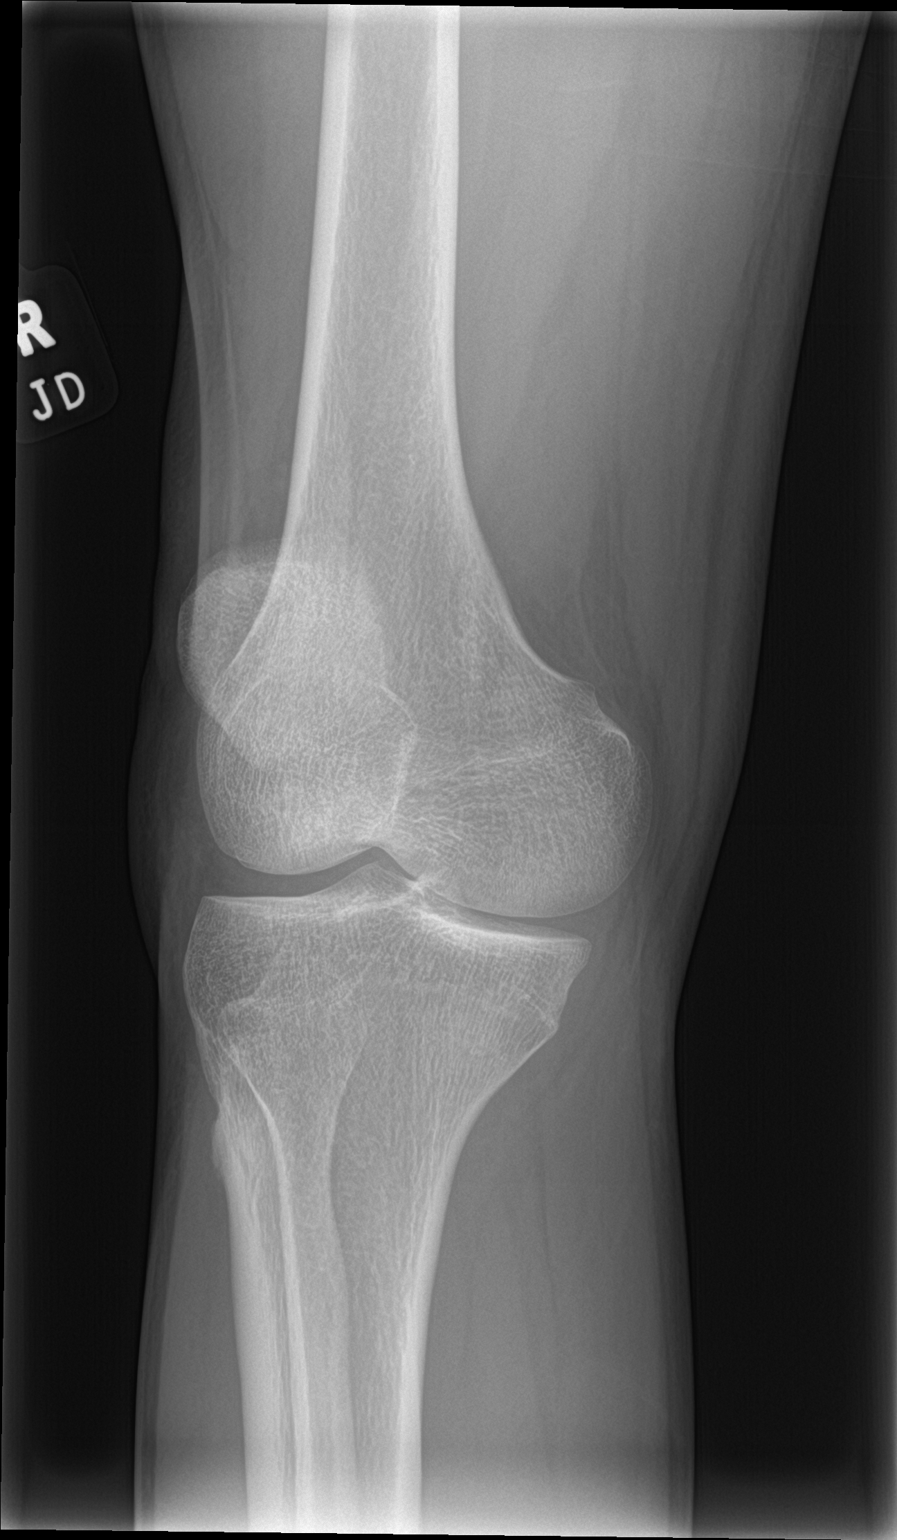

[knee obl (2 of 2)]
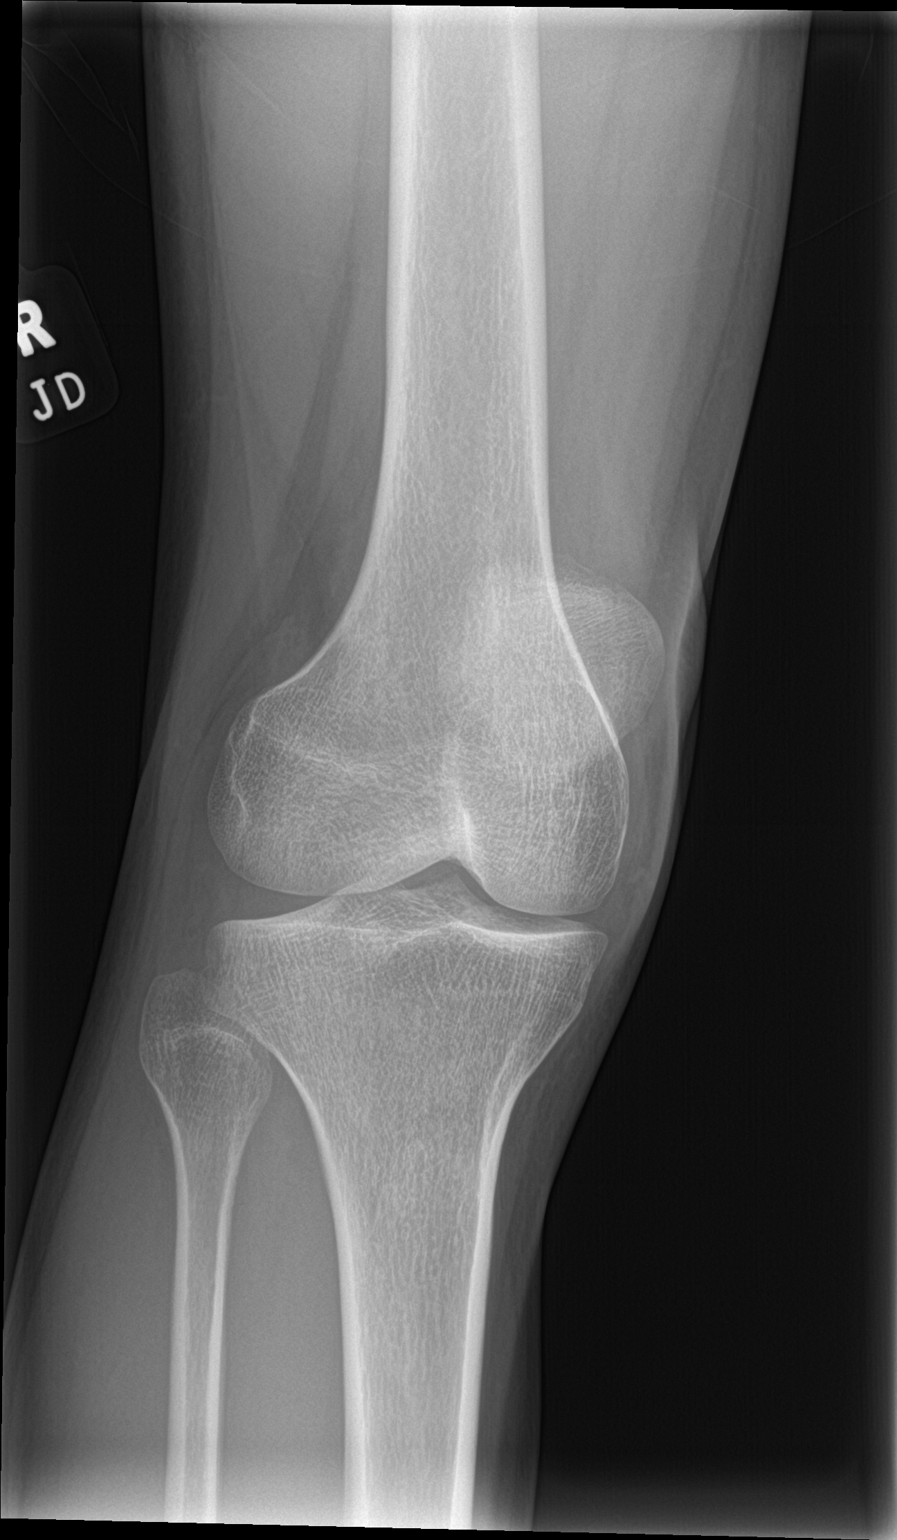

[knee lat]
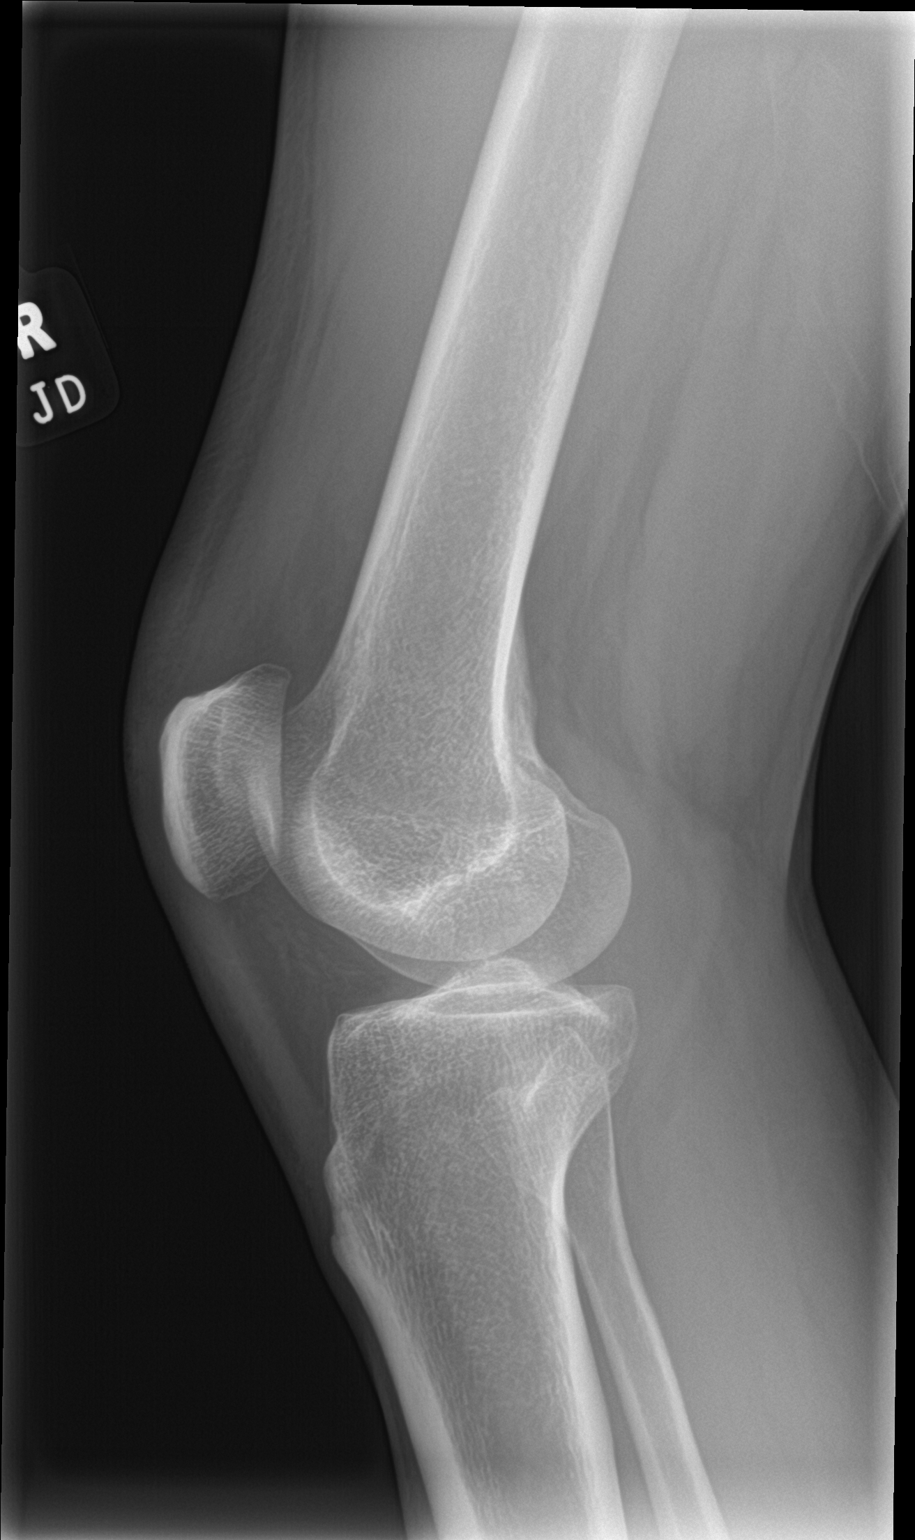

[4 of 4 positions shown; findings below may reference images not displayed]

FINDINGS: Normal bone mineralization. Joint spaces are preserved. No joint
effusion. No acute fracture or dislocation.
IMPRESSION: Normal right knee radiographs.

## 2024-01-18 ENCOUNTER — Other Ambulatory Visit: Payer: Self-pay

## 2024-01-18 ENCOUNTER — Encounter (HOSPITAL_COMMUNITY): Payer: Self-pay | Admitting: Emergency Medicine

## 2024-01-18 ENCOUNTER — Ambulatory Visit (HOSPITAL_COMMUNITY)
Admission: EM | Admit: 2024-01-18 | Discharge: 2024-01-18 | Disposition: A | Discharge status: Home / Self Care | Payer: Self-pay | Attending: Family Medicine | Admitting: Family Medicine

## 2024-01-18 DIAGNOSIS — K0889 Other specified disorders of teeth and supporting structures: Secondary | ICD-10-CM

## 2024-01-18 MED ORDER — CHLORHEXIDINE GLUCONATE 0.12 % MT SOLN: 15.0000 mL | Freq: Two times a day (BID) | OROMUCOSAL | 0 refills | Status: AC

## 2024-01-18 MED ORDER — NAPROXEN 500 MG PO TABS: 500.0000 mg | ORAL_TABLET | Freq: Two times a day (BID) | ORAL | 0 refills | Status: AC

## 2024-01-18 MED ORDER — PENICILLIN V POTASSIUM 500 MG PO TABS: 500.0000 mg | ORAL_TABLET | Freq: Three times a day (TID) | ORAL | 0 refills | Status: AC

## 2024-01-18 NOTE — ED Triage Notes (Signed)
 Generalized dental pain x 3 weeks.

## 2024-01-18 NOTE — Discharge Instructions (Addendum)
 Nice seeing you. I am sorry about your dental pain. You do have mild gum inflammation. I have sent in mouthwash, oral antibiotics, and pain medications to your pharmacy. Please schedule a follow up soon with the dentist for reevaluation

## 2024-01-18 NOTE — ED Provider Notes (Addendum)
 [facility]    CSN: [id number] Arrival date & time: 01/18/24  1721      History   Chief Complaint Chief Complaint  Patient presents with  . Dental Pain    HPI Tony Lucas is a 28 y.o. male.   The history is provided by the patient. No language interpreter was used.  Dental Pain Location:  Upper and lower (C/O gum pain of both upper and lower teeth x 3 weeks) Quality:  Sharp Severity:  Moderate Onset quality:  Gradual Duration:  3 weeks Timing:  Constant Progression:  Worsening Chronicity:  New Context: not abscess, not dental caries and not dental fracture   Prior workup: Denies previous dental work-up. Relieved by:  NSAIDs (Ibuprofen  200 mg makes pain better and then it comes right back) Associated symptoms: gum swelling   Associated symptoms: no difficulty swallowing, no drooling, no facial pain, no facial swelling, no fever and no oral lesions   Associated symptoms comment:  Feel like his mouth is starting to swell up a bit Risk factors: no alcohol problem, no diabetes and no smoking     History reviewed. No pertinent past medical history.  There are no active problems to display for this patient.   History reviewed. No pertinent surgical history.     Home Medications    Prior to Admission medications   Medication Sig Start Date End Date Taking? Authorizing Provider  chlorhexidine (PERIDEX) 0.12 % solution Use as directed 15 mLs in the mouth or throat 2 (two) times daily. Swish and spit BID 01/18/24  Yes Thomasena Vandenheuvel, Al Decant T, MD  naproxen (NAPROSYN) 500 MG tablet Take 1 tablet (500 mg total) by mouth 2 (two) times daily for 15 days. 01/18/24 02/02/24 Yes Doreene Eland, MD  penicillin v potassium (VEETID) 500 MG tablet Take 1 tablet (500 mg total) by mouth 3 (three) times daily for 5 days. 01/18/24 01/23/24 Yes Doreene Eland, MD  cyclobenzaprine (FLEXERIL) 10 MG tablet Take 1 tablet (10 mg total) by mouth 2 (two) times daily as needed for up to  14 doses for muscle spasms. 10/21/23   Terald Sleeper, MD  ibuprofen (ADVIL) 600 MG tablet Take 1 tablet (600 mg total) by mouth every 6 (six) hours as needed for up to 30 doses for mild pain (pain score 1-3) or moderate pain (pain score 4-6). 10/21/23   Terald Sleeper, MD  potassium chloride (KLOR-CON) 10 MEQ tablet Take 1 tablet (10 mEq total) by mouth daily for 14 days. 10/21/23 11/04/23  Terald Sleeper, MD    Family History Family History  Problem Relation Age of Onset  . Hypertension Mother   . Rheum arthritis Neg Hx   . Healthy Neg Hx   . Osteoarthritis Neg Hx   . Asthma Neg Hx   . Cancer Neg Hx   . Diabetes Neg Hx   . Hyperlipidemia Neg Hx   . Heart failure Neg Hx     Social History Social History   Tobacco Use  . Smoking status: Never  . Smokeless tobacco: Never  Vaping Use  . Vaping status: Never Used  Substance Use Topics  . Alcohol use: No  . Drug use: No     Allergies   Patient has no known allergies.   Review of Systems Review of Systems  Constitutional:  Negative for fever.  HENT:  Positive for dental problem. Negative for drooling, ear discharge, ear pain, facial swelling and mouth sores.   All  other systems reviewed and are negative.    Physical Exam Triage Vital Signs ED Triage Vitals  Encounter Vitals Group     BP      Systolic BP Percentile      Diastolic BP Percentile      Pulse      Resp      Temp      Temp src      SpO2      Weight      Height      Head Circumference      Peak Flow      Pain Score      Pain Loc      Pain Education      Exclude from Growth Chart    No data found.  Updated Vital Signs BP 115/74 (BP Location: Right Arm)   Pulse (!) 55   Temp 98.2 F (36.8 C) (Oral)   Resp 18   SpO2 98%   Visual Acuity Right Eye Distance:   Left Eye Distance:   Bilateral Distance:    Right Eye Near:   Left Eye Near:    Bilateral Near:     Physical Exam Vitals and nursing note reviewed.  HENT:     Head:  Normocephalic.     Comments: No facial swelling or erythema.     Mouth/Throat:     Mouth: Mucous membranes are moist.     Dentition: No dental caries or dental abscesses.     Pharynx: Oropharynx is clear.     Tonsils: No tonsillar exudate or tonsillar abscesses.     Comments: Very mild gingival swelling.  Cardiovascular:     Rate and Rhythm: Normal rate and regular rhythm.     Heart sounds: Normal heart sounds. No murmur heard. Pulmonary:     Effort: Pulmonary effort is normal. No respiratory distress.     Breath sounds: Normal breath sounds. No wheezing.      UC Treatments / Results  Labs (all labs ordered are listed, but only abnormal results are displayed) Labs Reviewed - No data to display  EKG   Radiology No results found.  Procedures Procedures (including critical care time)  Medications Ordered in UC Medications - No data to display  Initial Impression / Assessment and Plan / UC Course  I have reviewed the triage vital signs and the nursing notes.  Pertinent labs & imaging results that were available during my care of the patient were reviewed by me and considered in my medical decision making (see chart for details).  Clinical Course as of 01/18/24 1836  Wynelle Link Jan 18, 2024  1821 Gingivitis No dental abscess Penicillin V, Ibuprofen and Peridex mouth wash escribed F/U with dentist for dental care recommended Information about [facility] provided They will call for an appointment [KE]    Clinical Course User Index [KE] Doreene Eland, MD    Final Clinical Impressions(s) / UC Diagnoses   Final diagnoses:  Pain, dental     Discharge Instructions      Nice seeing you. I am sorry about your dental pain. You do have mild gum inflammation. I have sent in mouthwash, oral antibiotics, and pain medications to your pharmacy. Please schedule a follow up soon with the dentist for reevaluation     ED Prescriptions     Medication Sig Dispense  Auth. Provider   chlorhexidine (PERIDEX) 0.12 % solution Use as directed 15 mLs in the mouth or throat 2 (two) times daily. Swish  and spit BID 120 mL Janit Pagan T, MD   penicillin v potassium (VEETID) 500 MG tablet Take 1 tablet (500 mg total) by mouth 3 (three) times daily for 5 days. 15 tablet Sereniti Wan, Al Decant T, MD   naproxen (NAPROSYN) 500 MG tablet Take 1 tablet (500 mg total) by mouth 2 (two) times daily for 15 days. 30 tablet Doreene Eland, MD      PDMP not reviewed this encounter.   Doreene Eland, MD 01/18/24 1308    Doreene Eland, MD 01/18/24 [phone]

## 2024-11-02 ENCOUNTER — Ambulatory Visit
Admission: EM | Admit: 2024-11-02 | Discharge: 2024-11-02 | Disposition: A | Discharge status: Home / Self Care | Payer: Self-pay | Attending: Physician Assistant | Admitting: Physician Assistant

## 2024-11-02 DIAGNOSIS — N4889 Other specified disorders of penis: Secondary | ICD-10-CM

## 2024-11-02 DIAGNOSIS — Z113 Encounter for screening for infections with a predominantly sexual mode of transmission: Secondary | ICD-10-CM

## 2024-11-02 DIAGNOSIS — R399 Unspecified symptoms and signs involving the genitourinary system: Secondary | ICD-10-CM

## 2024-11-02 LAB — POCT URINE DIPSTICK
Bilirubin, UA: NEGATIVE
Blood, UA: NEGATIVE
Glucose, UA: NEGATIVE mg/dL
Ketones, POC UA: NEGATIVE mg/dL
Leukocytes, UA: NEGATIVE
Nitrite, UA: NEGATIVE
POC PROTEIN,UA: NEGATIVE
Spec Grav, UA: >=1.03 — AB (ref 1.010–1.025)
Urobilinogen, UA: 0.2 U/dL
pH, UA: 6 (ref 5.0–8.0)

## 2024-11-02 NOTE — ED Provider Notes (Signed)
 [facility]    CSN: [id number] Arrival date & time: 11/02/24  1732      History   Chief Complaint Chief Complaint  Patient presents with   Urinary Frequency   Back Pain    HPI Tony Lucas is a 28 y.o. male.  has no past medical history on file.   HPI  Discussed the use of AI scribe software for clinical note transcription with the patient, who gave verbal consent to proceed.     History reviewed. No pertinent past medical history.  There are no active problems to display for this patient.   History reviewed. No pertinent surgical history.     Home Medications    Prior to Admission medications   Medication Sig Start Date End Date Taking? Authorizing Provider  ibuprofen  (ADVIL ) 600 MG tablet Take 1 tablet (600 mg total) by mouth every 6 (six) hours as needed for up to 30 doses for mild pain (pain score 1-3) or moderate pain (pain score 4-6). 10/21/23  Yes Cottie Donnice PARAS, MD  chlorhexidine  (PERIDEX ) 0.12 % solution Use as directed 15 mLs in the mouth or throat 2 (two) times daily. Swish and spit BID Patient not taking: Reported on 11/02/2024 01/18/24   Anders Otto DASEN, MD  cyclobenzaprine  (FLEXERIL ) 10 MG tablet Take 1 tablet (10 mg total) by mouth 2 (two) times daily as needed for up to 14 doses for muscle spasms. Patient not taking: Reported on 11/02/2024 10/21/23   Cottie Donnice PARAS, MD  potassium chloride  (KLOR-CON ) 10 MEQ tablet Take 1 tablet (10 mEq total) by mouth daily for 14 days. 10/21/23 11/04/23  Cottie Donnice PARAS, MD    Family History Family History  Problem Relation Age of Onset   Hypertension Mother    Rheum arthritis Neg Hx    Healthy Neg Hx    Osteoarthritis Neg Hx    Asthma Neg Hx    Cancer Neg Hx    Diabetes Neg Hx    Hyperlipidemia Neg Hx    Heart failure Neg Hx     Social History Social History   Tobacco Use   Smoking status: Never   Smokeless tobacco: Never  Vaping Use   Vaping status: Never Used  Substance  Use Topics   Alcohol use: Yes    Comment: occ   Drug use: Yes    Types: Marijuana     Allergies   Patient has no known allergies.   Review of Systems Review of Systems  Constitutional:  Negative for chills and fever.  Gastrointestinal:  Negative for abdominal pain, diarrhea, nausea and vomiting.  Genitourinary:  Positive for frequency. Negative for difficulty urinating, dysuria, genital sores, penile discharge, penile pain, penile swelling, scrotal swelling and testicular pain.  Musculoskeletal:  Positive for back pain.  Skin:  Negative for rash.     Physical Exam Triage Vital Signs ED Triage Vitals  Encounter Vitals Group     BP 11/02/24 1934 116/71     Girls Systolic BP Percentile --      Girls Diastolic BP Percentile --      Boys Systolic BP Percentile --      Boys Diastolic BP Percentile --      Pulse Rate 11/02/24 1934 75     Resp 11/02/24 1934 14     Temp 11/02/24 1934 98.3 F (36.8 C)     Temp Source 11/02/24 1934 Oral     SpO2 --      Weight 11/02/24 1934 145  lb (65.8 kg)     Height --      Head Circumference --      Peak Flow --      Pain Score 11/02/24 1954 7     Pain Loc --      Pain Education --      Exclude from Growth Chart --    No data found.  Updated Vital Signs BP 116/71 (BP Location: Right Arm)   Pulse 75   Temp 98.3 F (36.8 C) (Oral)   Resp 14   Wt 145 lb (65.8 kg)   BMI 23.40 kg/m   Visual Acuity Right Eye Distance:   Left Eye Distance:   Bilateral Distance:    Right Eye Near:   Left Eye Near:    Bilateral Near:     Physical Exam Vitals reviewed.  Constitutional:      General: He is awake.     Appearance: Normal appearance. He is well-developed and well-groomed.  HENT:     Head: Normocephalic and atraumatic.  Eyes:     Extraocular Movements: Extraocular movements intact.     Conjunctiva/sclera: Conjunctivae normal.  Pulmonary:     Effort: Pulmonary effort is normal.  Genitourinary:    Comments: GU exam declined by  patient today  Musculoskeletal:     Cervical back: Normal range of motion.  Neurological:     Mental Status: He is alert and oriented to person, place, and time.  Psychiatric:        Attention and Perception: Attention normal.        Mood and Affect: Mood normal.        Speech: Speech normal.        Behavior: Behavior normal. Behavior is cooperative.      UC Treatments / Results  Labs (all labs ordered are listed, but only abnormal results are displayed) Labs Reviewed  POCT URINE DIPSTICK - Abnormal; Notable for the following components:      Result Value   Spec Grav, UA >=1.030 (*)    All other components within normal limits  SYPHILIS: RPR W/REFLEX TO RPR TITER AND TREPONEMAL ANTIBODIES, TRADITIONAL SCREENING AND DIAGNOSIS ALGORITHM  HIV ANTIBODY (ROUTINE TESTING W REFLEX)  CYTOLOGY, (ORAL, ANAL, URETHRAL) ANCILLARY ONLY    EKG   Radiology No results found.  Procedures Procedures (including critical care time)  Medications Ordered in UC Medications - No data to display  Initial Impression / Assessment and Plan / UC Course  I have reviewed the triage vital signs and the nursing notes.  Pertinent labs & imaging results that were available during my care of the patient were reviewed by me and considered in my medical decision making (see chart for details).      Final Clinical Impressions(s) / UC Diagnoses   Final diagnoses:  UTI symptoms  Penile irritation  Screening examination for STD (sexually transmitted disease)   Discharge Instructions   None    ED Prescriptions   None    PDMP not reviewed this encounter.

## 2024-11-02 NOTE — ED Triage Notes (Addendum)
 Since around 11/30 he has been having increased urination and intermittent lower back pain.  Denies any discharge or known exposures.   He has tried Ibuprofen  without relief.

## 2024-11-02 NOTE — Discharge Instructions (Signed)
 VISIT SUMMARY:  You came in today because you have been experiencing increased urinary frequency and a sensation in your penis that doesn't feel right since October 24, 2024. You do not have any discharge, swelling, sores, fever, chills, difficulty urinating, or skin changes on your penis. You also mentioned intermittent lower back pain but do not think it is related to your urinary symptoms. You are sexually active and have not been informed of any potential exposure to sexually transmitted diseases.  YOUR PLAN:  -URINARY FREQUENCY AND PENILE DISCOMFORT: You have been experiencing increased urinary frequency and mild penile discomfort since October 24, 2024. Your urinalysis was negative for a urinary tract infection and blood in the urine. We performed a self-collected swab for sexually transmitted infection (STI) testing and offered HIV and syphilis blood tests. If all tests are negative, you should follow up with your primary care doctor. Urinary frequency can be caused by various factors, including infections or other underlying conditions.  -SCREENING FOR SEXUALLY TRANSMITTED INFECTIONS: Given your urinary symptoms and penile discomfort, we performed a self-collected swab for STI testing and offered HIV and syphilis blood tests. Screening for STIs is important because they can sometimes cause symptoms like yours. Your results will be available in MyChart, and we will call you if any tests come back positive.  INSTRUCTIONS:  Please check MyChart for your test results. We will call you if any of the tests are positive. If all tests are negative and your symptoms persist, please follow up with your primary care doctor.

## 2024-11-04 LAB — HIV ANTIBODY (ROUTINE TESTING W REFLEX): HIV Screen 4th Generation wRfx: NONREACTIVE

## 2024-11-04 LAB — SYPHILIS: RPR W/REFLEX TO RPR TITER AND TREPONEMAL ANTIBODIES, TRADITIONAL SCREENING AND DIAGNOSIS ALGORITHM: RPR Ser Ql: NONREACTIVE

## 2024-11-05 ENCOUNTER — Ambulatory Visit: Payer: Self-pay | Admitting: Physician Assistant

## 2024-11-05 NOTE — Progress Notes (Signed)
 Testing negative for HIV and syphilis

## 2024-11-08 LAB — CYTOLOGY, (ORAL, ANAL, URETHRAL) ANCILLARY ONLY
Chlamydia: NEGATIVE
Comment: NEGATIVE
Comment: NEGATIVE
Comment: NORMAL
Neisseria Gonorrhea: NEGATIVE
Trichomonas: NEGATIVE
